# Patient Record
Sex: Male | Born: 1980 | Race: White | Marital: Married | Smoking: Never smoker
Health system: Southern US, Community
[De-identification: ages and names within clinical notes are randomized; demographics above are authoritative.]

## PROBLEM LIST (undated history)

## (undated) DIAGNOSIS — N200 Calculus of kidney: Secondary | ICD-10-CM

---

## 2016-01-03 ENCOUNTER — Emergency Department: Payer: 59

## 2016-01-03 ENCOUNTER — Encounter
Admission: EM | Disposition: A | Discharge status: Home / Self Care | Payer: Self-pay | Source: Home / Self Care | Attending: Emergency Medicine

## 2016-01-03 ENCOUNTER — Encounter: Payer: Self-pay | Admitting: Emergency Medicine

## 2016-01-03 ENCOUNTER — Emergency Department: Payer: 59 | Admitting: Anesthesiology

## 2016-01-03 ENCOUNTER — Observation Stay
Admission: EM | Admit: 2016-01-03 | Discharge: 2016-01-04 | Disposition: A | Discharge status: Home / Self Care | Payer: 59 | Attending: General Surgery | Admitting: General Surgery

## 2016-01-03 DIAGNOSIS — Z87442 Personal history of urinary calculi: Secondary | ICD-10-CM | POA: Insufficient documentation

## 2016-01-03 DIAGNOSIS — R1031 Right lower quadrant pain: Secondary | ICD-10-CM | POA: Insufficient documentation

## 2016-01-03 DIAGNOSIS — K3580 Unspecified acute appendicitis: Principal | ICD-10-CM | POA: Insufficient documentation

## 2016-01-03 DIAGNOSIS — K37 Unspecified appendicitis: Secondary | ICD-10-CM | POA: Diagnosis present

## 2016-01-03 HISTORY — PX: LAPAROSCOPIC APPENDECTOMY: SHX408

## 2016-01-03 HISTORY — DX: Calculus of kidney: N20.0

## 2016-01-03 LAB — COMPREHENSIVE METABOLIC PANEL
ALBUMIN: 5.2 g/dL — AB (ref 3.5–5.0)
ALT: 26 U/L (ref 17–63)
ANION GAP: 15 (ref 5–15)
AST: 22 U/L (ref 15–41)
Alkaline Phosphatase: 76 U/L (ref 38–126)
BUN: 15 mg/dL (ref 6–20)
CHLORIDE: 104 mmol/L (ref 101–111)
CO2: 18 mmol/L — AB (ref 22–32)
Calcium: 9.6 mg/dL (ref 8.9–10.3)
Creatinine, Ser: 1.09 mg/dL (ref 0.61–1.24)
GFR calc non Af Amer: >60 mL/min (ref >60)
GLUCOSE: 145 mg/dL — AB (ref 65–99)
POTASSIUM: 3.6 mmol/L (ref 3.5–5.1)
SODIUM: 137 mmol/L (ref 135–145)
Total Bilirubin: 0.9 mg/dL (ref 0.3–1.2)
Total Protein: 8.1 g/dL (ref 6.5–8.1)

## 2016-01-03 LAB — CBC WITH DIFFERENTIAL/PLATELET
BASOS PCT: 0 %
Basophils Absolute: 0 10*3/uL (ref 0–0.1)
EOS ABS: 0 10*3/uL (ref 0–0.7)
EOS PCT: 0 %
HCT: 49.2 % (ref 40.0–52.0)
HEMOGLOBIN: 16.9 g/dL (ref 13.0–18.0)
LYMPHS PCT: 16 %
Lymphs Abs: 2.3 10*3/uL (ref 1.0–3.6)
MCH: 27.4 pg (ref 26.0–34.0)
MCHC: 34.3 g/dL (ref 32.0–36.0)
MCV: 80 fL (ref 80.0–100.0)
MONO ABS: 0.6 10*3/uL (ref 0.2–1.0)
Monocytes Relative: 4 %
NEUTROS ABS: 11.8 10*3/uL — AB (ref 1.4–6.5)
Neutrophils Relative %: 80 %
PLATELETS: 230 10*3/uL (ref 150–440)
RBC: 6.15 MIL/uL — ABNORMAL HIGH (ref 4.40–5.90)
RDW: 13.2 % (ref 11.5–14.5)
WBC: 14.6 10*3/uL — ABNORMAL HIGH (ref 3.8–10.6)

## 2016-01-03 LAB — URINALYSIS COMPLETE WITH MICROSCOPIC (ARMC ONLY)
BACTERIA UA: NONE SEEN
Bilirubin Urine: NEGATIVE
GLUCOSE, UA: NEGATIVE mg/dL
HGB URINE DIPSTICK: NEGATIVE
LEUKOCYTES UA: NEGATIVE
NITRITE: NEGATIVE
PH: 9 — AB (ref 5.0–8.0)
PROTEIN: NEGATIVE mg/dL
SPECIFIC GRAVITY, URINE: 1.026 (ref 1.005–1.030)
Squamous Epithelial / LPF: NONE SEEN

## 2016-01-03 LAB — LIPASE, BLOOD: Lipase: 26 U/L (ref 11–51)

## 2016-01-03 SURGERY — APPENDECTOMY, LAPAROSCOPIC
Anesthesia: General | Site: Abdomen | Wound class: Clean Contaminated

## 2016-01-03 MED ORDER — HYDROMORPHONE HCL 1 MG/ML IJ SOLN: 1.0000 mg | INTRAMUSCULAR | Status: DC | PRN

## 2016-01-03 MED ORDER — FENTANYL CITRATE (PF) 100 MCG/2ML IJ SOLN
INTRAMUSCULAR | Status: DC | PRN
  Administered 2016-01-03: 50 ug via INTRAVENOUS
  Administered 2016-01-03: 100 ug via INTRAVENOUS
  Administered 2016-01-03: 50 ug via INTRAVENOUS

## 2016-01-03 MED ORDER — ACETAMINOPHEN 10 MG/ML IV SOLN
INTRAVENOUS | Status: DC | PRN
  Administered 2016-01-03: 1000 mg via INTRAVENOUS

## 2016-01-03 MED ORDER — LACTATED RINGERS IV SOLN
INTRAVENOUS | Status: DC | PRN
  Administered 2016-01-03: 18:00:00 via INTRAVENOUS

## 2016-01-03 MED ORDER — ONDANSETRON HCL 4 MG/2ML IJ SOLN: 4.0000 mg | Freq: Once | INTRAMUSCULAR | Status: DC | PRN

## 2016-01-03 MED ORDER — PROPOFOL 10 MG/ML IV BOLUS
INTRAVENOUS | Status: DC | PRN
  Administered 2016-01-03: 150 mg via INTRAVENOUS

## 2016-01-03 MED ORDER — GLYCOPYRROLATE 0.2 MG/ML IJ SOLN
INTRAMUSCULAR | Status: DC | PRN
  Administered 2016-01-03: 0.4 mg via INTRAVENOUS

## 2016-01-03 MED ORDER — IOHEXOL 240 MG/ML SOLN
25.0000 mL | Freq: Once | INTRAMUSCULAR | Status: AC | PRN
  Administered 2016-01-03: 25 mL via ORAL

## 2016-01-03 MED ORDER — DIPHENHYDRAMINE HCL 50 MG/ML IJ SOLN: 25.0000 mg | Freq: Four times a day (QID) | INTRAMUSCULAR | Status: DC | PRN

## 2016-01-03 MED ORDER — DEXTROSE 5 % IV SOLN
2.0000 g | INTRAVENOUS | Status: DC
  Filled 2016-01-03 (×2): qty 2

## 2016-01-03 MED ORDER — DEXTROSE 5 % IV SOLN: 2.0000 g | INTRAVENOUS | Status: DC

## 2016-01-03 MED ORDER — HYDRALAZINE HCL 20 MG/ML IJ SOLN: 10.0000 mg | INTRAMUSCULAR | Status: DC | PRN

## 2016-01-03 MED ORDER — METRONIDAZOLE IN NACL 5-0.79 MG/ML-% IV SOLN: 500.0000 mg | Freq: Three times a day (TID) | INTRAVENOUS | Status: DC

## 2016-01-03 MED ORDER — ONDANSETRON HCL 4 MG/2ML IJ SOLN
4.0000 mg | Freq: Once | INTRAMUSCULAR | Status: AC
  Administered 2016-01-03: 4 mg via INTRAVENOUS
  Filled 2016-01-03: qty 2

## 2016-01-03 MED ORDER — OXYCODONE-ACETAMINOPHEN 5-325 MG PO TABS: 1.0000 | ORAL_TABLET | ORAL | Status: DC | PRN

## 2016-01-03 MED ORDER — BUPIVACAINE-EPINEPHRINE (PF) 0.25% -1:200000 IJ SOLN
INTRAMUSCULAR | Status: DC | PRN
  Administered 2016-01-03: 10 mL via PERINEURAL

## 2016-01-03 MED ORDER — LIDOCAINE HCL (CARDIAC) 20 MG/ML IV SOLN
INTRAVENOUS | Status: DC | PRN
  Administered 2016-01-03: 50 mg via INTRAVENOUS

## 2016-01-03 MED ORDER — LIDOCAINE HCL 1 % IJ SOLN
INTRAMUSCULAR | Status: DC | PRN
  Administered 2016-01-03: 10 mL

## 2016-01-03 MED ORDER — ROCURONIUM BROMIDE 100 MG/10ML IV SOLN
INTRAVENOUS | Status: DC | PRN
  Administered 2016-01-03: 30 mg via INTRAVENOUS

## 2016-01-03 MED ORDER — SUCCINYLCHOLINE CHLORIDE 20 MG/ML IJ SOLN
INTRAMUSCULAR | Status: DC | PRN
  Administered 2016-01-03: 100 mg via INTRAVENOUS

## 2016-01-03 MED ORDER — HYDROMORPHONE HCL 1 MG/ML IJ SOLN
0.5000 mg | Freq: Once | INTRAMUSCULAR | Status: AC
  Administered 2016-01-03: 0.5 mg via INTRAVENOUS
  Filled 2016-01-03: qty 1

## 2016-01-03 MED ORDER — ONDANSETRON HCL 4 MG/2ML IJ SOLN
INTRAMUSCULAR | Status: DC | PRN
  Administered 2016-01-03: 4 mg via INTRAVENOUS

## 2016-01-03 MED ORDER — DEXAMETHASONE SODIUM PHOSPHATE 10 MG/ML IJ SOLN
INTRAMUSCULAR | Status: DC | PRN
  Administered 2016-01-03: 4 mg via INTRAVENOUS

## 2016-01-03 MED ORDER — ONDANSETRON 4 MG PO TBDP: 4.0000 mg | ORAL_TABLET | Freq: Four times a day (QID) | ORAL | Status: DC | PRN

## 2016-01-03 MED ORDER — FENTANYL CITRATE (PF) 100 MCG/2ML IJ SOLN: 25.0000 ug | INTRAMUSCULAR | Status: DC | PRN

## 2016-01-03 MED ORDER — ONDANSETRON HCL 4 MG/2ML IJ SOLN: 4.0000 mg | Freq: Four times a day (QID) | INTRAMUSCULAR | Status: DC | PRN

## 2016-01-03 MED ORDER — IOHEXOL 300 MG/ML  SOLN
100.0000 mL | Freq: Once | INTRAMUSCULAR | Status: AC | PRN
  Administered 2016-01-03: 100 mL via INTRAVENOUS

## 2016-01-03 MED ORDER — NEOSTIGMINE METHYLSULFATE 10 MG/10ML IV SOLN
INTRAVENOUS | Status: DC | PRN
  Administered 2016-01-03: 3 mg via INTRAVENOUS

## 2016-01-03 MED ORDER — SODIUM CHLORIDE 0.9 % IV BOLUS (SEPSIS)
1000.0000 mL | Freq: Once | INTRAVENOUS | Status: AC
  Administered 2016-01-03: 1000 mL via INTRAVENOUS

## 2016-01-03 MED ORDER — DIPHENHYDRAMINE HCL 25 MG PO CAPS: 25.0000 mg | ORAL_CAPSULE | Freq: Four times a day (QID) | ORAL | Status: DC | PRN

## 2016-01-03 MED ORDER — METRONIDAZOLE IN NACL 5-0.79 MG/ML-% IV SOLN
500.0000 mg | Freq: Three times a day (TID) | INTRAVENOUS | Status: DC
  Administered 2016-01-04 (×2): 500 mg via INTRAVENOUS
  Filled 2016-01-03 (×5): qty 100

## 2016-01-03 MED ORDER — LACTATED RINGERS IV SOLN
INTRAVENOUS | Status: DC
  Administered 2016-01-03: 20:00:00 via INTRAVENOUS

## 2016-01-03 MED ORDER — HYDROMORPHONE HCL 1 MG/ML IJ SOLN
1.0000 mg | Freq: Once | INTRAMUSCULAR | Status: AC
  Administered 2016-01-03: 1 mg via INTRAVENOUS
  Filled 2016-01-03: qty 1

## 2016-01-03 MED ORDER — MIDAZOLAM HCL 2 MG/2ML IJ SOLN
INTRAMUSCULAR | Status: DC | PRN
  Administered 2016-01-03: 2 mg via INTRAVENOUS

## 2016-01-03 SURGICAL SUPPLY — 39 items
APPLIER CLIP 5 13 M/L LIGAMAX5 (MISCELLANEOUS)
BLADE SURG SZ11 CARB STEEL (BLADE) ×3 IMPLANT
CANISTER SUCT 1200ML W/VALVE (MISCELLANEOUS) ×3 IMPLANT
CATH TRAY 16F METER LATEX (MISCELLANEOUS) ×3 IMPLANT
CHLORAPREP W/TINT 26ML (MISCELLANEOUS) ×3 IMPLANT
CLIP APPLIE 5 13 M/L LIGAMAX5 (MISCELLANEOUS) IMPLANT
CLOSURE WOUND 1/2 X4 (GAUZE/BANDAGES/DRESSINGS) ×1
CUTTER FLEX LINEAR 45M (STAPLE) ×3 IMPLANT
DRSG TEGADERM 2-3/8X2-3/4 SM (GAUZE/BANDAGES/DRESSINGS) ×9 IMPLANT
DRSG TELFA 3X8 NADH (GAUZE/BANDAGES/DRESSINGS) ×3 IMPLANT
ENDOPOUCH RETRIEVER 10 (MISCELLANEOUS) IMPLANT
GAUZE SPONGE NON-WVN 2X2 STRL (MISCELLANEOUS) ×3 IMPLANT
GLOVE BIO SURGEON STRL SZ7.5 (GLOVE) ×3 IMPLANT
GLOVE INDICATOR 8.0 STRL GRN (GLOVE) ×3 IMPLANT
GOWN STRL REUS W/ TWL LRG LVL3 (GOWN DISPOSABLE) ×2 IMPLANT
GOWN STRL REUS W/TWL LRG LVL3 (GOWN DISPOSABLE) ×4
IRRIGATION STRYKERFLOW (MISCELLANEOUS) IMPLANT
IRRIGATOR STRYKERFLOW (MISCELLANEOUS)
IV NS 1000ML (IV SOLUTION) ×2
IV NS 1000ML BAXH (IV SOLUTION) ×1 IMPLANT
KIT RM TURNOVER STRD PROC AR (KITS) ×3 IMPLANT
LABEL OR SOLS (LABEL) ×3 IMPLANT
LIQUID BAND (GAUZE/BANDAGES/DRESSINGS) IMPLANT
NEEDLE HYPO 25X1 1.5 SAFETY (NEEDLE) ×3 IMPLANT
NEEDLE VERESS 14GA 120MM (NEEDLE) ×3 IMPLANT
NS IRRIG 500ML POUR BTL (IV SOLUTION) ×3 IMPLANT
PACK LAP CHOLECYSTECTOMY (MISCELLANEOUS) ×3 IMPLANT
PAD GROUND ADULT SPLIT (MISCELLANEOUS) ×3 IMPLANT
RELOAD 45 VASCULAR/THIN (ENDOMECHANICALS) ×3 IMPLANT
SLEEVE ENDOPATH XCEL 5M (ENDOMECHANICALS) ×3 IMPLANT
SPONGE VERSALON 2X2 STRL (MISCELLANEOUS) ×6
STRIP CLOSURE SKIN 1/2X4 (GAUZE/BANDAGES/DRESSINGS) ×2 IMPLANT
SUT MNCRL 4-0 (SUTURE) ×2
SUT MNCRL 4-0 27XMFL (SUTURE) ×1
SUTURE MNCRL 4-0 27XMF (SUTURE) ×1 IMPLANT
SWABSTK COMLB BENZOIN TINCTURE (MISCELLANEOUS) ×3 IMPLANT
TROCAR XCEL 12X100 BLDLESS (ENDOMECHANICALS) ×3 IMPLANT
TROCAR XCEL NON-BLD 5MMX100MML (ENDOMECHANICALS) ×3 IMPLANT
TUBING INSUFFLATOR HI FLOW (MISCELLANEOUS) ×3 IMPLANT

## 2016-01-03 NOTE — H&P (Signed)
Patient ID: Steve ManisDavid Woodbeck, male   DOB: [birth year], 35 y.o.   MRN: [id number]   CC: ABDOMINAL PAIN  HPI Steve Moyer is a 35 y.o. male presents emergency department with a 1 day history of abdominal pain. Patient states that the pain started periumbilically and gradually moved to his right lower quadrant throughout the day. He had subjective chills but not any fevers at home. He denies any nausea, vomiting, chest pain, shortness breath, diarrhea, constipation. He is otherwise in his usual state of excellent health. He says that the pain is sharp with palpation but otherwise is a dull ache that is constantly there has gradually worsened throughout the day.  HPI  Past Medical History  Diagnosis Date  . Kidney stones     History reviewed. No pertinent past surgical history.  No family history on file.  Social History Social History  Substance Use Topics  . Smoking status: Never Smoker   . Smokeless tobacco: None  . Alcohol Use: None    No Known Allergies  No current facility-administered medications for this encounter.   No current outpatient prescriptions on file.     Review of Systems A multi-point review of systems was asked and was negative except for findings document in the history of present illness  Physical Exam Blood pressure 125/71, pulse 62, temperature 97.5 F (36.4 C), temperature source Oral, resp. rate 14, height 6' (1.829 m), weight 81.647 kg (180 lb), SpO2 98 %. CONSTITUTIONAL: No acute distress. EYES: Pupils are equal, round, and reactive to light, Sclera are non-icteric. EARS, NOSE, MOUTH AND THROAT: The oropharynx is clear. The oral mucosa is pink and moist. Hearing is intact to voice. LYMPH NODES:  Lymph nodes in the neck are normal. RESPIRATORY:  Lungs are clear. There is normal respiratory effort, with equal breath sounds bilaterally, and without pathologic use of accessory muscles. CARDIOVASCULAR: Heart is regular without murmurs, gallops, or rubs. GI:  The abdomen is hirsute, soft, tender to palpation in the right lower quadrant with a positive McBurney's, and nondistended. No evidence of diffuse peritonitis, negative Rovsing. There are no palpable masses. There is no hepatosplenomegaly. There are normal bowel sounds in all quadrants. GU: Rectal deferred.   MUSCULOSKELETAL: Normal muscle strength and tone. No cyanosis or edema.   SKIN: Turgor is good and there are no pathologic skin lesions or ulcers. NEUROLOGIC: Motor and sensation is grossly normal. Cranial nerves are grossly intact. PSYCH:  Oriented to person, place and time. Affect is normal.  Data Reviewed Images and labs reviewed. Labs showed leukocytosis of 14.3, CT scan shows a dilated inflamed appendix consistent with acute appendicitis. I have personally reviewed the patient's imaging, laboratory findings and medical records.    Assessment    Appendicitis    Plan    Discussed with the patient and his wife the diagnosis and treatment of acute appendicitis. The procedure of a laparoscopic appendectomy was described in detail to include the risks, benefits, alternatives. Patient was an ascending wishes to proceed. We will proceed to the operating room from the emergency department for a laparoscopic appendectomy.     Time spent with the patient was 30 minutes, with more than 50% of the time spent in face-to-face education, counseling and care coordination.     Ricarda Frameharles Cage Gupton, MD FACS General Surgeon 01/03/2016, 5:34 PM

## 2016-01-03 NOTE — ED Notes (Signed)
RLQ pain that began last pm, history of kidney stones, pt states this is not the same pain. Wife at bedside.

## 2016-01-03 NOTE — ED Provider Notes (Signed)
[facility] Emergency Department Provider Note   ____________________________________________  Time seen: 1305  I have reviewed the triage vital signs and the nursing notes.   HISTORY  Chief Complaint Abdominal Pain   History limited by: Not Limited   HPI Steve Moyer is a 35 y.o. male with a history of kidney stones who presents to the emergency department today because of concerns for right lower quadrant abdominal pain. The patient states that the pain started last night. He described it as starting in the periumbilical region. It has been constant since then although it is now migrated more to the right lower quadrant. He states the pain is severe. He has had some nausea with this. He states that he feels like he has rebound tenderness with this. No bloody stools or diarrhea. No problems with urination. He has not had any measured fevers.     Past Medical History  Diagnosis Date  . Kidney stones     There are no active problems to display for this patient.   History reviewed. No pertinent past surgical history.  No current outpatient prescriptions on file.  Allergies Review of patient's allergies indicates no known allergies.  No family history on file.  Social History Social History  Substance Use Topics  . Smoking status: Never Smoker   . Smokeless tobacco: None  . Alcohol Use: None    Review of Systems  Constitutional: Negative for fever. Cardiovascular: Negative for chest pain. Respiratory: Negative for shortness of breath. Gastrointestinal: Positive for right sided lower abdominal pain Genitourinary: Negative for dysuria. Neurological: Negative for headaches, focal weakness or numbness.  10-point ROS otherwise negative.  ____________________________________________   PHYSICAL EXAM:  VITAL SIGNS: ED Triage Vitals  Enc Vitals Group     BP --      Pulse Rate 01/03/16 1309 101     Resp 01/03/16 1309 18     Temp  01/03/16 1309 97.5 F (36.4 C)     Temp Source 01/03/16 1309 Oral     SpO2 01/03/16 1309 98 %     Weight 01/03/16 1309 180 lb (81.647 kg)     Height 01/03/16 1309 6' (1.829 m)     Head Cir --      Peak Flow --      Pain Score 01/03/16 1307 10   Constitutional: Alert and oriented. Appears uncomfortable. Eyes: Conjunctivae are normal. PERRL. Normal extraocular movements. ENT   Head: Normocephalic and atraumatic.   Nose: No congestion/rhinnorhea.   Mouth/Throat: Mucous membranes are moist.   Neck: No stridor. Hematological/Lymphatic/Immunilogical: No cervical lymphadenopathy. Cardiovascular: Normal rate, regular rhythm.  No murmurs, rubs, or gallops. Respiratory: Normal respiratory effort without tachypnea nor retractions. Breath sounds are clear and equal bilaterally. No wheezes/rales/rhonchi. Gastrointestinal: Soft. Tender to palpation in the RLQ with mild rebound. Mild Rovsing's sign. No pain with heel strike. Genitourinary: Deferred Musculoskeletal: Normal range of motion in all extremities. No joint effusions.  No lower extremity tenderness nor edema. Neurologic:  Normal speech and language. No gross focal neurologic deficits are appreciated.  Skin:  Skin is warm, dry and intact. No rash noted. Psychiatric: Mood and affect are normal. Speech and behavior are normal. Patient exhibits appropriate insight and judgment.  ____________________________________________    LABS (pertinent positives/negatives)  Labs Reviewed  CBC WITH DIFFERENTIAL/PLATELET - Abnormal; Notable for the following:    WBC 14.6 (*)    RBC 6.15 (*)    Neutro Abs 11.8 (*)    All other components within normal  limits  COMPREHENSIVE METABOLIC PANEL - Abnormal; Notable for the following:    CO2 18 (*)    Glucose, Bld 145 (*)    Albumin 5.2 (*)    All other components within normal limits  LIPASE, BLOOD  URINALYSIS COMPLETEWITH MICROSCOPIC ([facility] ONLY)      ____________________________________________   EKG  None  ____________________________________________    RADIOLOGY  Ct abd/pel IMPRESSION: Enlarged appendix with wall thickening and associated stranding consistent with early appendicitis. No appendicolith.  I, Phineas SemenGOODMAN, Jaquitta Dupriest, personally discussed these images and results by phone with the on-call radiologist and used this discussion as part of my medical decision making.   ____________________________________________   PROCEDURES  Procedure(s) performed: None  Critical Care performed: No  ____________________________________________   INITIAL IMPRESSION / ASSESSMENT AND PLAN / ED COURSE  Pertinent labs & imaging results that were available during my care of the patient were reviewed by me and considered in my medical decision making (see chart for details).  She presented to the emergency department today because of right lower quadrant abdominal pain. Physical exam, blood work and CT scan all consistent with acute appendicitis. I have paged surgery to come and evaluate the patient.  ____________________________________________   FINAL CLINICAL IMPRESSION(S) / ED DIAGNOSES  Final diagnoses:  Acute appendicitis, unspecified acute appendicitis type     Phineas SemenGraydon Mattisyn Cardona, MD 01/03/16 2138

## 2016-01-03 NOTE — Anesthesia Preprocedure Evaluation (Signed)
Anesthesia Evaluation  Patient identified by MRN, date of birth, ID band Patient awake    Reviewed: Allergy & Precautions, NPO status , Patient's Chart, lab work & pertinent test results  History of Anesthesia Complications Negative for: history of anesthetic complications  Airway Mallampati: I       Dental  (+) Teeth Intact   Pulmonary neg pulmonary ROS,           Cardiovascular negative cardio ROS       Neuro/Psych negative neurological ROS     GI/Hepatic negative GI ROS, Neg liver ROS,   Endo/Other  negative endocrine ROS  Renal/GU Renal disease (stones)     Musculoskeletal   Abdominal   Peds  Hematology negative hematology ROS (+)   Anesthesia Other Findings   Reproductive/Obstetrics                             Anesthesia Physical Anesthesia Plan  ASA: II  Anesthesia Plan:    Post-op Pain Management:    Induction: Intravenous  Airway Management Planned: Oral ETT  Additional Equipment:   Intra-op Plan:   Post-operative Plan:   Informed Consent: I have reviewed the patients History and Physical, chart, labs and discussed the procedure including the risks, benefits and alternatives for the proposed anesthesia with the patient or authorized representative who has indicated his/her understanding and acceptance.     Plan Discussed with:   Anesthesia Plan Comments:         Anesthesia Quick Evaluation

## 2016-01-03 NOTE — ED Notes (Signed)
Patient transported to CT 

## 2016-01-03 NOTE — Op Note (Signed)
laparascopic appendectomy   Steve Moyer Date of operation:  01/03/2016  Indications: The patient presented with a history of  abdominal pain. Workup has revealed findings consistent with acute appendicitis.  Pre-operative Diagnosis: Acute appendicitis without mention of peritonitis  Post-operative Diagnosis: Same  Surgeon: Leonette Mostharles T. Tonita CongWoodham, MD, FACS  Anesthesia: General with endotracheal tube  Procedure Details  The patient was seen again in the preop area. The options of surgery versus observation were reviewed with the patient and/or family. The risks of bleeding, infection, recurrence of symptoms, negative laparoscopy, potential for an open procedure, bowel injury, abscess or infection, were all reviewed as well. The patient was taken to Operating Room, identified as Steve ManisDavid Moyer and the procedure verified as laparoscopic appendectomy. A Time Out was held and the above information confirmed.  The patient was placed in the supine position and general anesthesia was induced.  Antibiotic prophylaxis was administered and VT E prophylaxis was in place. A Foley catheter was placed by the nursing staff.   The abdomen was prepped and draped in a sterile fashion. An infraumbilical incision was made. A Veress needle was placed and pneumoperitoneum was obtained. A 5 mm trocar port was placed without difficulty and the abdominal cavity was explored.  Under direct vision a 5 mm suprapubic port was placed and a 12 mm left lateral port was placed all under direct vision.  The appendix was identified and found to be acutely inflamed with inflammatory attachments to the lateral sidewall.   The appendix was carefully dissected. The base of the appendix was dissected out and divided with a standard load Endo GIA. The mesoappendix was divided with a vascular load Endo GIA. A single load encompassed the entirety of mesentery. There was noted to be pulsatile bleeding from the mesial appendix staple line. This  was controlled with a single 5 mm Endo Clip. The entire area was completed irrigated and irrigation returned clear without evidence of any active bleeding.  The appendix was passed out through the left lateral port site with the aid of an Endo Catch bag. The right lower quadrant and pelvis was then irrigated with copious amounts of normal saline which was aspirated. Inspection  failed to identify any additional bleeding and there were no signs of bowel injury. All our trochars were then removed under direct visualization without any evidence of active bleeding.    Again the right lower quadrant was inspected there was no sign of bleeding or bowel injury therefore pneumoperitoneum was released, all ports were removed and the skin incisions were approximated with subcuticular 4-0 Monocryl. Steri-Strips and benzoin and sterile dressings were placed.  The patient tolerated the procedure well, there were no complications. The sponge lap and needle count were correct at the end of the procedure.  The patient was taken to the recovery room in stable condition to be admitted for continued care.  Findings: Acute appendicitis  Estimated Blood Loss: 10 mL's                  Specimens: appendix         Complications:  None                  Steve Frameharles Bynum Mccullars MD, FACS

## 2016-01-03 NOTE — Brief Op Note (Signed)
01/03/2016  7:11 PM  PATIENT:  Steve Moyer  35 y.o. male  PRE-OPERATIVE DIAGNOSIS:  acute appendicitis  POST-OPERATIVE DIAGNOSIS:  acute appendicitis  PROCEDURE:  Procedure(s): APPENDECTOMY LAPAROSCOPIC (N/A)  SURGEON:  Surgeon(s) and Role:    * Ricarda Frameharles Yuval Nolet, MD - Primary  PHYSICIAN ASSISTANT:   ASSISTANTS: none   ANESTHESIA:   general  EBL:     BLOOD ADMINISTERED:none  DRAINS: none   LOCAL MEDICATIONS USED:  MARCAINE   , XYLOCAINE  and Amount: 20 ml  SPECIMEN:  Source of Specimen:  appendix  DISPOSITION OF SPECIMEN:  PATHOLOGY  COUNTS:  YES  TOURNIQUET:  * No tourniquets in log *  DICTATION: .Dragon Dictation  PLAN OF CARE: Admit for overnight observation  PATIENT DISPOSITION:  PACU - hemodynamically stable.   Delay start of Pharmacological VTE agent (>24hrs) due to surgical blood loss or risk of bleeding: no

## 2016-01-03 NOTE — Transfer of Care (Signed)
Immediate Anesthesia Transfer of Care Note  Patient: Steve ManisDavid Schlemmer  Procedure(s) Performed: Procedure(s): APPENDECTOMY LAPAROSCOPIC (N/A)  Patient Location: PACU  Anesthesia Type:General  Level of Consciousness: awake, alert , oriented and patient cooperative  Airway & Oxygen Therapy: Patient Spontanous Breathing and Patient connected to nasal cannula oxygen  Post-op Assessment: Report given to RN and Post -op Vital signs reviewed and stable  Post vital signs: Reviewed and stable  Last Vitals:  Filed Vitals:   01/03/16 1715 01/03/16 1927  BP:  146/86  Pulse: 62 96  Temp:  36.3 C  Resp: 14 15    Complications: No apparent anesthesia complications

## 2016-01-03 NOTE — ED Notes (Addendum)
Surgeon Wooden at bedside

## 2016-01-03 NOTE — ED Notes (Signed)
MD at bedside. 

## 2016-01-03 NOTE — Anesthesia Postprocedure Evaluation (Signed)
Anesthesia Post Note  Patient: Steve Moyer  Procedure(s) Performed: Procedure(s) (LRB): APPENDECTOMY LAPAROSCOPIC (N/A)  Patient location during evaluation: PACU Anesthesia Type: General Level of consciousness: awake and alert Pain management: pain level controlled Vital Signs Assessment: post-procedure vital signs reviewed and stable Respiratory status: spontaneous breathing and respiratory function stable Cardiovascular status: stable Anesthetic complications: no    Last Vitals:  Filed Vitals:   01/03/16 2011 01/03/16 2027  BP: 129/74 144/78  Pulse: 61 72  Temp: 36.7 C 36.7 C  Resp: 18 20    Last Pain:  Filed Vitals:   01/03/16 2027  PainSc: 0-No pain                 KEPHART,WILLIAM K

## 2016-01-04 ENCOUNTER — Encounter: Payer: Self-pay | Admitting: General Surgery

## 2016-01-04 LAB — CBC
HCT: 43.7 % (ref 40.0–52.0)
Hemoglobin: 15.5 g/dL (ref 13.0–18.0)
MCH: 28.8 pg (ref 26.0–34.0)
MCHC: 35.5 g/dL (ref 32.0–36.0)
MCV: 81.1 fL (ref 80.0–100.0)
PLATELETS: 171 10*3/uL (ref 150–440)
RBC: 5.38 MIL/uL (ref 4.40–5.90)
RDW: 12.8 % (ref 11.5–14.5)
WBC: 10.3 10*3/uL (ref 3.8–10.6)

## 2016-01-04 LAB — BASIC METABOLIC PANEL
Anion gap: 8 (ref 5–15)
BUN: 10 mg/dL (ref 6–20)
CALCIUM: 8.8 mg/dL — AB (ref 8.9–10.3)
CO2: 25 mmol/L (ref 22–32)
CREATININE: 0.9 mg/dL (ref 0.61–1.24)
Chloride: 105 mmol/L (ref 101–111)
Glucose, Bld: 143 mg/dL — ABNORMAL HIGH (ref 65–99)
Potassium: 4.2 mmol/L (ref 3.5–5.1)
SODIUM: 138 mmol/L (ref 135–145)

## 2016-01-04 MED ORDER — ACETAMINOPHEN 325 MG PO TABS
650.0000 mg | ORAL_TABLET | Freq: Four times a day (QID) | ORAL | Status: DC | PRN
  Administered 2016-01-04 (×2): 650 mg via ORAL
  Filled 2016-01-04 (×2): qty 2

## 2016-01-04 MED ORDER — OXYCODONE-ACETAMINOPHEN 5-325 MG PO TABS: 1.0000 | ORAL_TABLET | ORAL | Status: DC | PRN

## 2016-01-04 NOTE — Final Progress Note (Signed)
1 Day Post-Op   Subjective:  Uneventful night status post lap scopic appendectomy  Vital signs in last 24 hours: Temp:  [97.4 F (36.3 C)-98.1 F (36.7 C)] 97.9 F (36.6 C) (01/04 0342) Pulse Rate:  [61-101] 74 (01/04 0342) Resp:  [14-24] 16 (01/04 0342) BP: (122-146)/(66-86) 137/68 mmHg (01/04 0342) SpO2:  [96 %-100 %] 98 % (01/04 0342) Weight:  [81.647 kg (180 lb)] 81.647 kg (180 lb) (01/03 1309)    Intake/Output from previous day: 01/03 0701 - 01/04 0700 In: 900 [I.V.:900] Out: 1050 [Urine:1050]  GI: Abdomen is soft, nondistended. Well approximated laparoscopic appendectomy incisions with dressings are clean dry and intact. Minimally tender to palpation around the incision sites with no residual right lower quadrant pain.  Lab Results:  CBC  Recent Labs  01/03/16 1308 01/04/16 0546  WBC 14.6* 10.3  HGB 16.9 15.5  HCT 49.2 43.7  PLT 230 171   CMP     Component Value Date/Time   NA 138 01/04/2016 0546   K 4.2 01/04/2016 0546   CL 105 01/04/2016 0546   CO2 25 01/04/2016 0546   GLUCOSE 143* 01/04/2016 0546   BUN 10 01/04/2016 0546   CREATININE 0.90 01/04/2016 0546   CALCIUM 8.8* 01/04/2016 0546   PROT 8.1 01/03/2016 1308   ALBUMIN 5.2* 01/03/2016 1308   AST 22 01/03/2016 1308   ALT 26 01/03/2016 1308   ALKPHOS 76 01/03/2016 1308   BILITOT 0.9 01/03/2016 1308   GFRNONAA >60 01/04/2016 0546   GFRAA >60 01/04/2016 0546   PT/INR No results for input(s): LABPROT, INR in the last 72 hours.  Studies/Results: Ct Abdomen Pelvis W Contrast  01/03/2016  CLINICAL DATA:  RIGHT lower quadrant pain that began yesterday. EXAM: CT ABDOMEN AND PELVIS WITH CONTRAST TECHNIQUE: Multidetector CT imaging of the abdomen and pelvis was performed using the standard protocol following bolus administration of intravenous contrast. CONTRAST:  100mL OMNIPAQUE IOHEXOL 300 MG/ML  SOLN COMPARISON:  None. FINDINGS: Lower chest:  No acute findings. Hepatobiliary: No masses or other  significant abnormality. Pancreas: No mass, inflammatory changes, or other significant abnormality. Spleen: Within normal limits in size and appearance. Adrenals/Urinary Tract: No masses identified. No evidence of hydronephrosis. Stomach/Bowel: No evidence of obstruction, or abnormal fluid collections. Enlarged appendix, up to 11 mm in diameter, slightly thickened wall. No appendicolith. Periappendiceal stranding without fluid collection or free air. Vascular/Lymphatic: No pathologically enlarged lymph nodes. No evidence of abdominal aortic aneurysm. Reproductive: No mass or other significant abnormality. Other: None. Musculoskeletal:  No suspicious bone lesions identified. IMPRESSION: Enlarged appendix with wall thickening and associated stranding consistent with early appendicitis. No appendicolith. These results were called by telephone at the time of interpretation on 01/03/2016 at 2:36 pm to Dr. Phineas SemenGRAYDON GOODMAN , who verbally acknowledged these results. Electronically Signed   By: Elsie StainJohn T Curnes M.D.   On: 01/03/2016 14:37    Assessment/Plan: 35 year old male status post laparoscopic appendectomy for acute appendicitis. Did well postoperatively. Plan for discharge home after last dose of IV antibiotics this morning.   Ricarda Frameharles Aribelle Mccosh, MD FACS General Surgeon  01/04/2016

## 2016-01-04 NOTE — Progress Notes (Signed)
IV was removed. Discharge instructions, follow-up appointments, and prescriptions were provided to the pt. The pt is currently awaiting wife to pick him up.

## 2016-01-04 NOTE — Discharge Summary (Signed)
Patient ID: Steve ManisDavid Moyer MRN: [id number] DOB/AGE: August 17, 1981 35 y.o.  Admit date: 01/03/2016 Discharge date: 01/04/2016  Discharge Diagnoses:  Appendicitis   Procedures Performed: Laparoscopic Appendectomy   Discharged Condition: good  Hospital Course: Patient was taken to the operating room from the emergency department for a laparoscopic appendectomy. Surgery was uneventful and patient had an expected recovery. Patient is able tolerate regular diet as pain controlled on oral medications prior to discharge.  Discharge Orders:  discharge home  Disposition: Home  Discharge Medications:   .  oxyCODONE-acetaminophen (PERCOCET/ROXICET) 5-325 MG per tablet 1-2 tablet, 1-2 tablet, Oral, Q4H PRN, Ricarda Frameharles Almendra Loria, MD  Sharion SettlerFollwup: Follow-up Information    [facility] [place]. Schedule an appointment as soon as possible for a visit in 1 week.   Specialty:  General Surgery   Why:  postop   Contact information:   [address] [place] [id number] [phone]      Signed: Ricarda FrameCharles Rylee Nuzum 01/04/2016, 8:36 AM

## 2016-01-04 NOTE — Discharge Instructions (Signed)
Laparoscopic Appendectomy, Adult, Care After °Refer to this sheet in the next few weeks. These instructions provide you with information on caring for yourself after your procedure. Your caregiver may also give you more specific instructions. Your treatment has been planned according to current medical practices, but problems sometimes occur. Call your caregiver if you have any problems or questions after your procedure. °HOME CARE INSTRUCTIONS °· Do not drive while taking narcotic pain medicines. °· Use stool softener if you become constipated from your pain medicines. °· Change your bandages (dressings) as directed. °· Keep your wounds clean and dry. You may wash the wounds gently with soap and water. Gently pat the wounds dry with a clean towel. °· Do not take baths, swim, or use hot tubs for 10 days, or as instructed by your caregiver. °· Only take over-the-counter or prescription medicines for pain, discomfort, or fever as directed by your caregiver. °· You may continue your normal diet as directed. °· Do not lift more than 10 pounds (4.5 kg) or play contact sports for 3 weeks, or as directed. °· Slowly increase your activity after surgery. °· Take deep breaths to avoid getting a lung infection (pneumonia). °SEEK MEDICAL CARE IF: °· You have redness, swelling, or increasing pain in your wounds. °· You have pus coming from your wounds. °· You have drainage from a wound that lasts longer than 1 day. °· You notice a bad smell coming from the wounds or dressing. °· Your wound edges break open after stitches (sutures) have been removed. °· You notice increasing pain in the shoulders (shoulder strap areas) or near your shoulder blades. °· You develop dizzy episodes or fainting while standing. °· You develop shortness of breath. °· You develop persistent nausea or vomiting. °· You cannot control your bowel functions or lose your appetite. °· You develop diarrhea. °SEEK IMMEDIATE MEDICAL CARE IF:  °· You have a  fever. °· You develop a rash. °· You have difficulty breathing or sharp pains in your chest. °· You develop any reaction or side effects to medicines given. °MAKE SURE YOU: °· Understand these instructions. °· Will watch your condition. °· Will get help right away if you are not doing well or get worse. °  °This information is not intended to replace advice given to you by your health care provider. Make sure you discuss any questions you have with your health care provider. °  °Document Released: 12/17/2005 Document Revised: 05/03/2015 Document Reviewed: 06/06/2015 °Elsevier Interactive Patient Education ©2016 [employer] ° °

## 2016-01-05 ENCOUNTER — Telehealth: Payer: Self-pay

## 2016-01-05 LAB — SURGICAL PATHOLOGY

## 2016-01-05 NOTE — Telephone Encounter (Signed)
Post discharge call to patient made at this time. Incisional pain is controlled with no medication at this time. But patient states that he is having shoulder pain. I explained that he will have this for several days post surgery as the gas used during surgery has to get out of his body and the only thing that he can do to help this is increase ambulation as much as possible and use a heating pad to the area until this is reabsorbed by the body. He asks multiple times for Flexeril on the phone to help with this pain. I told him that this medication will not help with this type of pain. He does not agree with me at all but I informed him that he will feel better in a couple of days.  No further questions or concerns.  Confirmed patient appointment scheduled. Encouraged patient to call with any questions that arise prior to appointment.

## 2016-01-12 ENCOUNTER — Encounter: Payer: Self-pay | Admitting: General Surgery

## 2016-01-20 ENCOUNTER — Encounter: Payer: Self-pay | Admitting: Surgery

## 2016-01-20 ENCOUNTER — Ambulatory Visit (INDEPENDENT_AMBULATORY_CARE_PROVIDER_SITE_OTHER): Payer: 59 | Admitting: Surgery

## 2016-01-20 VITALS — BP 127/80 | HR 80 | Temp 98.8°F | Ht 72.0 in | Wt 186.6 lb

## 2016-01-20 DIAGNOSIS — I493 Ventricular premature depolarization: Secondary | ICD-10-CM | POA: Insufficient documentation

## 2016-01-20 DIAGNOSIS — K353 Acute appendicitis with localized peritonitis, without perforation or gangrene: Secondary | ICD-10-CM

## 2016-01-20 DIAGNOSIS — R002 Palpitations: Secondary | ICD-10-CM | POA: Insufficient documentation

## 2016-01-20 NOTE — Patient Instructions (Signed)
Please call with any questions or concerns.

## 2016-01-20 NOTE — Progress Notes (Signed)
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 He has been back to work.  Abdomen is soft and nontender wounds are healing well no erythema no drainage.  Pathology is reviewed  Patient doing very well recommend follow up on an as-needed basis

## 2016-02-06 DIAGNOSIS — K3580 Unspecified acute appendicitis: Secondary | ICD-10-CM | POA: Insufficient documentation

## 2016-07-15 IMAGING — CT CT ABD-PELV W/ CM
2 of 4 series · 16 of 46 positions shown, 18 images · IV contrast (omnipaque)
Comparison: None.

CLINICAL DATA: RIGHT lower quadrant pain that began yesterday.

EXAM:
CT ABDOMEN AND PELVIS WITH CONTRAST
TECHNIQUE: Multidetector CT imaging of the abdomen and pelvis was performed
using the standard protocol following bolus administration of
intravenous contrast.
CONTRAST:  100mL OMNIPAQUE IOHEXOL 300 MG/ML  SOLN

[Series 2: routine abd pel with · axial · 0.70mm/px · z∈[-552,-112]mm · 13 of 98 slices shown, 15 images]
[im 5/98  soft-tissue]
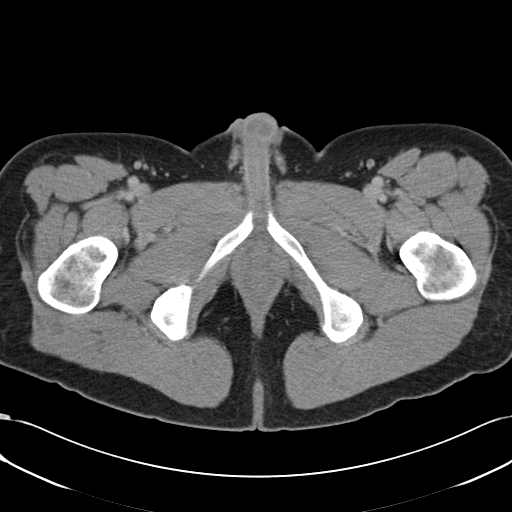
[im 5/98  bone]
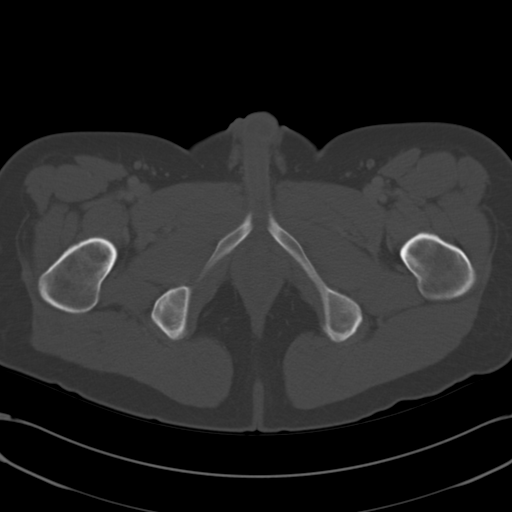
[im 13/98  soft-tissue]
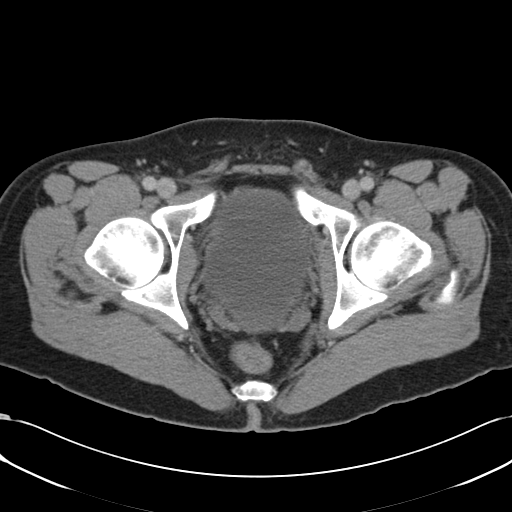
[im 22/98  soft-tissue]
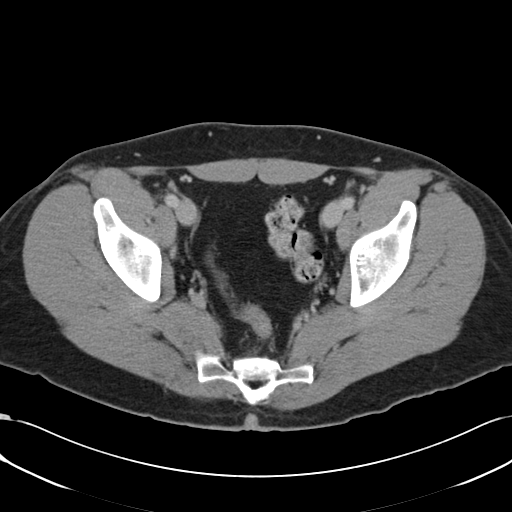
[im 26/98  soft-tissue]
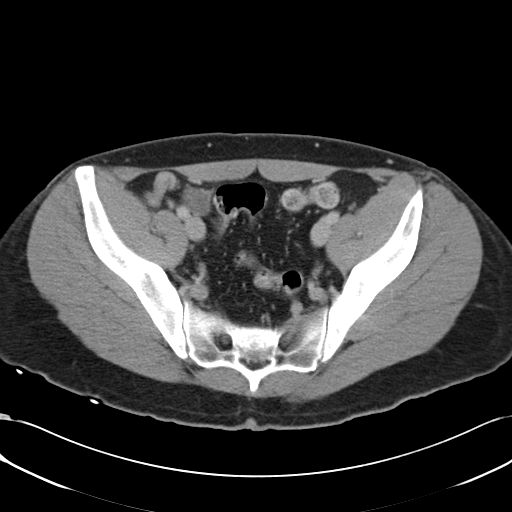
[im 34/98  soft-tissue]
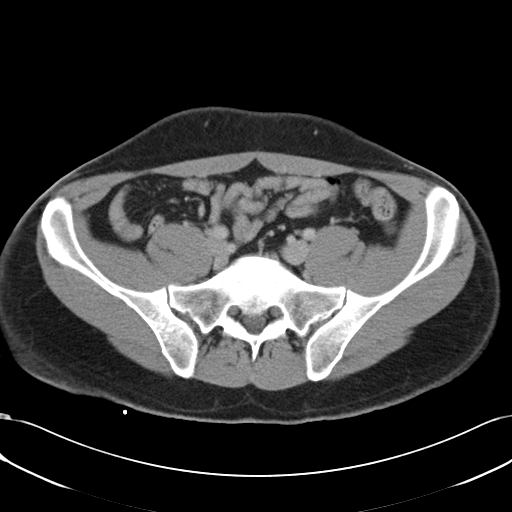
[im 43/98  soft-tissue]
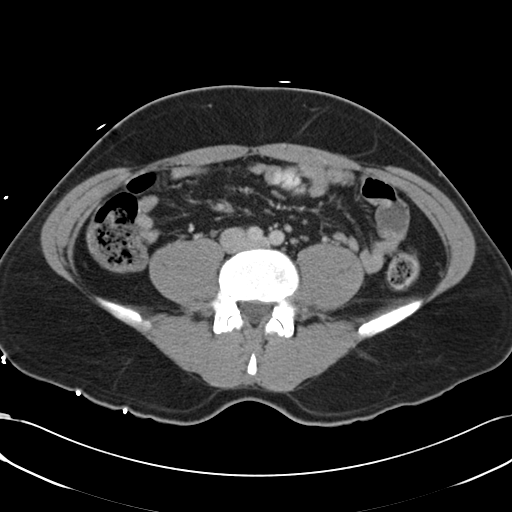
[im 51/98  soft-tissue]
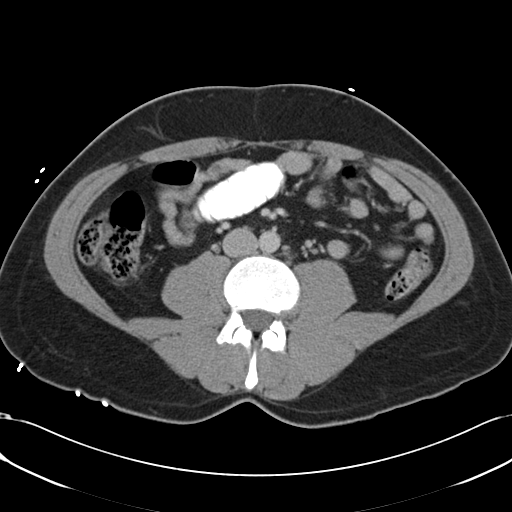
[im 55/98  soft-tissue]
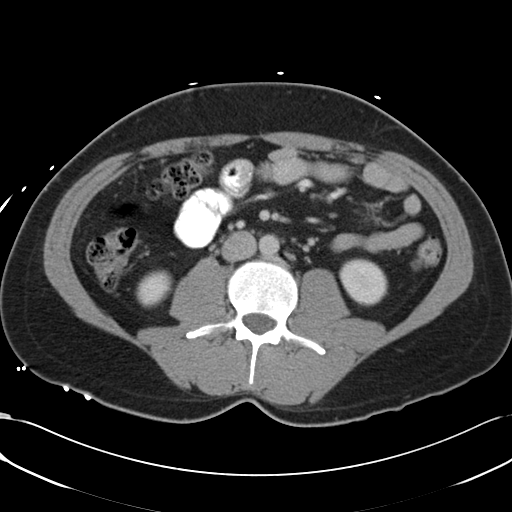
[im 64/98  soft-tissue]
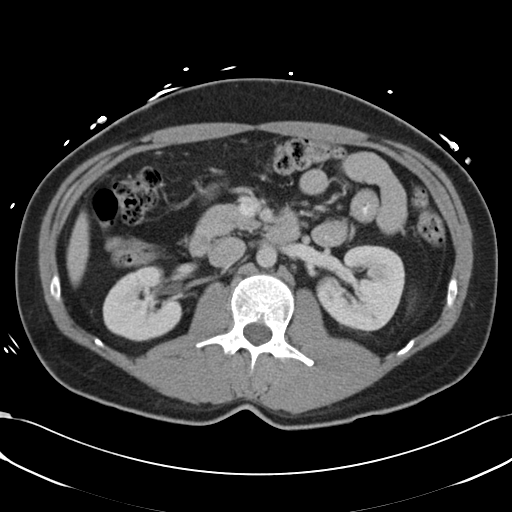
[im 64/98  bone]
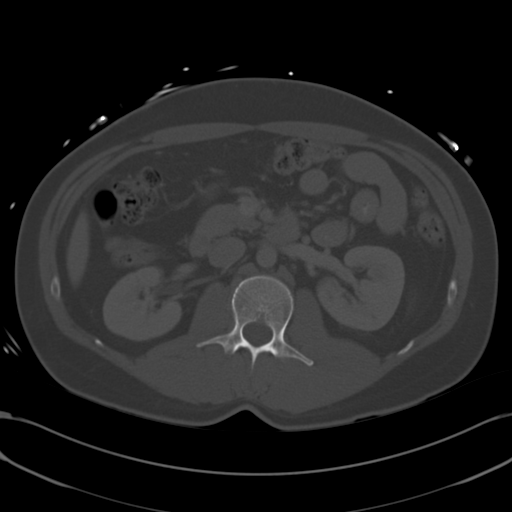
[im 72/98  soft-tissue]
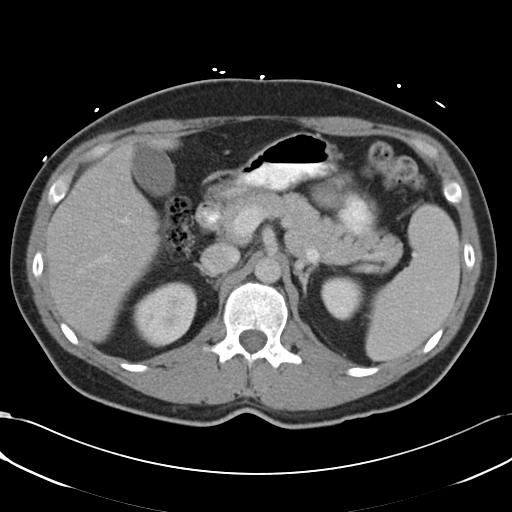
[im 76/98  soft-tissue]
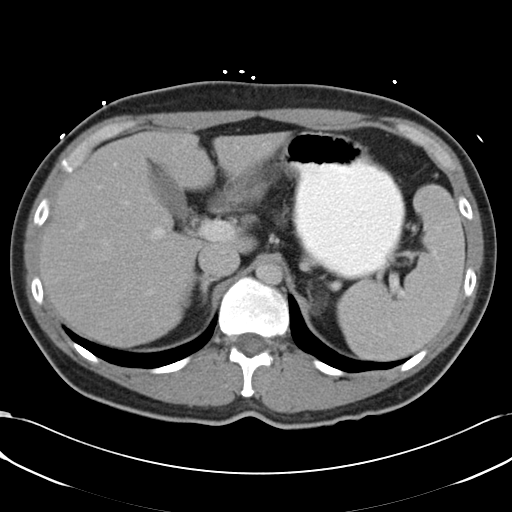
[im 85/98  soft-tissue]
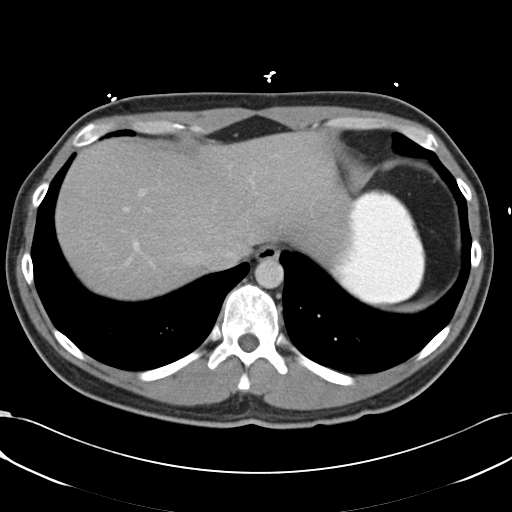
[im 93/98  soft-tissue]
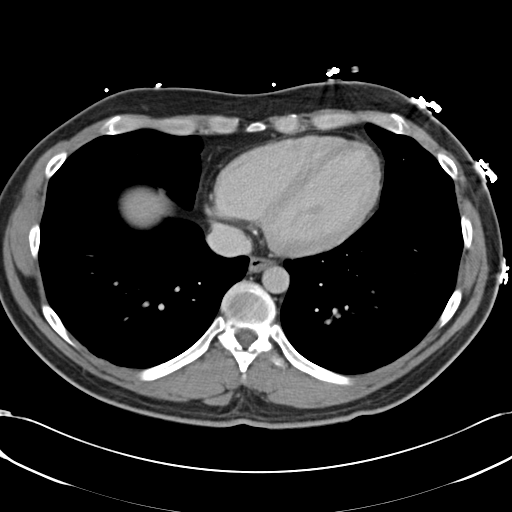

[Series 5: cor routine abd pel with · coronal · 0.73mm/px · 3 of 130 slices shown]
[im 44/130  soft-tissue]
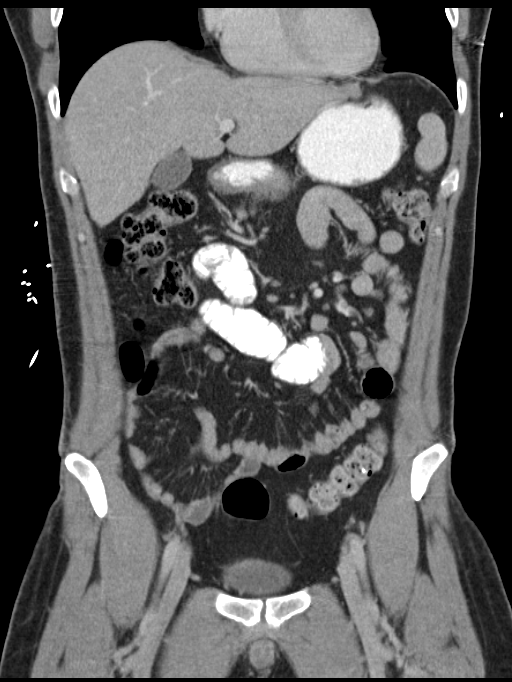
[im 58/130  soft-tissue]
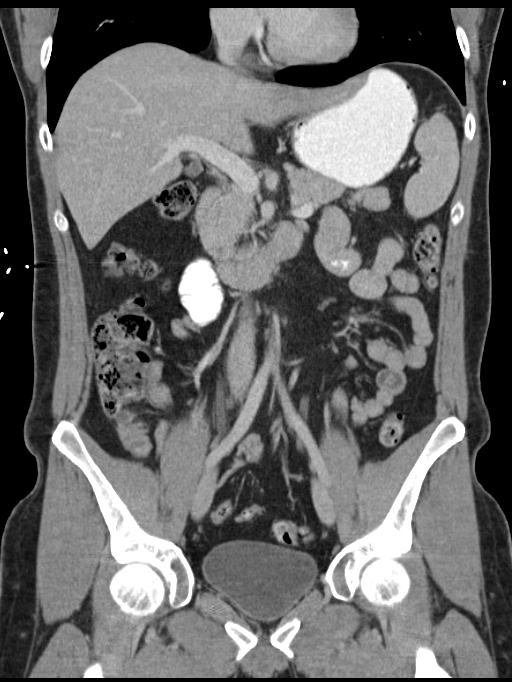
[im 72/130  soft-tissue]
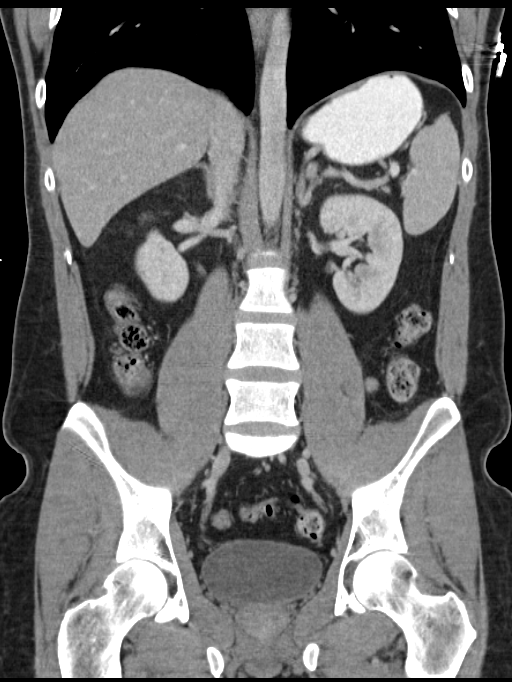

[16 of 46 positions shown; findings below may reference images not displayed]

FINDINGS: Lower chest:  No acute findings.

Hepatobiliary: No masses or other significant abnormality.

Pancreas: No mass, inflammatory changes, or other significant
abnormality.

Spleen: Within normal limits in size and appearance.

Adrenals/Urinary Tract: No masses identified. No evidence of
hydronephrosis.

Stomach/Bowel: No evidence of obstruction, or abnormal fluid
collections. Enlarged appendix, up to 11 mm in diameter, slightly
thickened wall. No appendicolith. Periappendiceal stranding without
fluid collection or free air.

Vascular/Lymphatic: No pathologically enlarged lymph nodes. No
evidence of abdominal aortic aneurysm.

Reproductive: No mass or other significant abnormality.

Other: None.

Musculoskeletal:  No suspicious bone lesions identified.
IMPRESSION: Enlarged appendix with wall thickening and associated stranding
consistent with early appendicitis. No appendicolith.

These results were called by telephone at the time of interpretation
on 01/03/2016 at [DATE] to Dr. NARDING NICK , who verbally
acknowledged these results.
